# Patient Record
Sex: Female | Born: 1995 | Race: White | Hispanic: No | Marital: Single | Smoking: Never smoker
Health system: Southern US, Community
[De-identification: ages and names within clinical notes are randomized; demographics above are authoritative.]

---

## 2014-04-17 ENCOUNTER — Ambulatory Visit: Payer: Self-pay | Admitting: Medical

## 2015-10-12 ENCOUNTER — Other Ambulatory Visit: Payer: Self-pay | Admitting: Obstetrics and Gynecology

## 2015-10-12 DIAGNOSIS — N6320 Unspecified lump in the left breast, unspecified quadrant: Principal | ICD-10-CM

## 2015-10-12 DIAGNOSIS — N6325 Unspecified lump in the left breast, overlapping quadrants: Secondary | ICD-10-CM

## 2015-10-29 ENCOUNTER — Ambulatory Visit: Payer: Self-pay | Attending: Obstetrics and Gynecology

## 2016-08-07 ENCOUNTER — Emergency Department: Payer: Self-pay

## 2016-08-07 ENCOUNTER — Encounter: Payer: Self-pay | Admitting: Emergency Medicine

## 2016-08-07 DIAGNOSIS — W2209XA Striking against other stationary object, initial encounter: Secondary | ICD-10-CM | POA: Insufficient documentation

## 2016-08-07 DIAGNOSIS — Y999 Unspecified external cause status: Secondary | ICD-10-CM | POA: Insufficient documentation

## 2016-08-07 DIAGNOSIS — Y939 Activity, unspecified: Secondary | ICD-10-CM | POA: Insufficient documentation

## 2016-08-07 DIAGNOSIS — S62326A Displaced fracture of shaft of fifth metacarpal bone, right hand, initial encounter for closed fracture: Secondary | ICD-10-CM | POA: Insufficient documentation

## 2016-08-07 DIAGNOSIS — Y929 Unspecified place or not applicable: Secondary | ICD-10-CM | POA: Insufficient documentation

## 2016-08-07 NOTE — ED Triage Notes (Signed)
Pt ambulatory to triage in NAD, report punched door with right hand, now pain and swelling.  Pt states initially some deformity but someone squeezed hand and popped back into place.  Swelling noted.

## 2016-08-08 ENCOUNTER — Emergency Department
Admission: EM | Admit: 2016-08-08 | Discharge: 2016-08-08 | Disposition: A | Discharge status: Home / Self Care | Payer: Self-pay | Attending: Emergency Medicine | Admitting: Emergency Medicine

## 2016-08-08 DIAGNOSIS — S62326A Displaced fracture of shaft of fifth metacarpal bone, right hand, initial encounter for closed fracture: Secondary | ICD-10-CM

## 2016-08-08 DIAGNOSIS — S62339A Displaced fracture of neck of unspecified metacarpal bone, initial encounter for closed fracture: Secondary | ICD-10-CM

## 2016-08-08 MED ORDER — OXYCODONE-ACETAMINOPHEN 5-325 MG PO TABS
1.0000 | ORAL_TABLET | Freq: Once | ORAL | Status: AC
  Administered 2016-08-08: 1 via ORAL
  Filled 2016-08-08: qty 1

## 2016-08-08 MED ORDER — OXYCODONE-ACETAMINOPHEN 5-325 MG PO TABS: 1.0000 | ORAL_TABLET | Freq: Four times a day (QID) | ORAL | 0 refills | Status: AC | PRN

## 2016-08-08 NOTE — ED Provider Notes (Signed)
[facility] Emergency Department Provider Note   ____________________________________________   First MD Initiated Contact with Patient 08/08/16 0302     (approximate)  I have reviewed the triage vital signs and the nursing notes.   HISTORY  Chief Complaint Hand Injury    HPI Lauren Shields is a 21 y.o. female who comes into the hospital today with thinking she might a broken her hand. The patient reports that she punched a door. She reports that she iced it in the waiting room. She didn't take any medicine but did have some pain after she punched the door. The patient rates her pain a 7-8 out of 10 in intensity and reports this worse when she moves it but not that bad when she is holding it still. The patient is able to move her fingers and denies any numbness or tingling. Her sensation is intact. She reports that she has broken her wrist before but never her hand. The patient denies any nausea or vomiting. She is here today for evaluation.   History reviewed. No pertinent past medical history.  There are no active problems to display for this patient.   History reviewed. No pertinent surgical history.  Prior to Admission medications   Medication Sig Start Date End Date Taking? Authorizing Provider  oxyCODONE-acetaminophen (ROXICET) 5-325 MG tablet Take 1 tablet by mouth every 6 (six) hours as needed. 08/08/16   Rebecka Apley, MD    Allergies Gardasil Gershon Crane vaccine recombinant (yeast derived)]  History reviewed. No pertinent family history.  Social History Social History  Substance Use Topics  . Smoking status: Never Smoker  . Smokeless tobacco: Never Used  . Alcohol use Yes    Review of Systems  Constitutional: No fever/chills Eyes: No visual changes. ENT: No sore throat. Cardiovascular: Denies chest pain. Respiratory: Denies shortness of breath. Gastrointestinal: No abdominal pain.  No nausea, no vomiting.  No diarrhea.  No  constipation. Genitourinary: Negative for dysuria. Musculoskeletal: Right hand pain Skin: Negative for rash. Neurological: Negative for headaches, focal weakness or numbness.   ____________________________________________   PHYSICAL EXAM:  VITAL SIGNS: ED Triage Vitals  Enc Vitals Group     BP 08/07/16 2315 111/81     Pulse Rate 08/07/16 2315 79     Resp 08/07/16 2315 18     Temp 08/07/16 2315 98.9 F (37.2 C)     Temp Source 08/07/16 2315 Oral     SpO2 08/07/16 2315 98 %     Weight 08/07/16 2316 120 lb (54.4 kg)     Height 08/07/16 2316 5\' 4"  (1.626 m)     Head Circumference --      Peak Flow --      Pain Score 08/07/16 2315 5     Pain Loc --      Pain Edu? --      Excl. in GC? --     Constitutional: Alert and oriented. Well appearing and in Moderate distress. Eyes: Conjunctivae are normal. PERRL. EOMI. Head: Atraumatic. Nose: No congestion/rhinnorhea. Mouth/Throat: Mucous membranes are moist.  Oropharynx non-erythematous. Cardiovascular: Normal rate, regular rhythm. Grossly normal heart sounds.  Good peripheral circulation. Respiratory: Normal respiratory effort.  No retractions. Lungs CTAB. Gastrointestinal: Soft and nontender. No distention. Positive bowel sounds Musculoskeletal: Soft tissue swelling to the right lateral dorsal hand with some bruising noted in to palpation but sensation color and motion intact distally.  Neurologic:  Normal speech and language.  Skin:  Skin is warm, dry and intact.  Psychiatric: Mood and affect are normal.   ____________________________________________   LABS (all labs ordered are listed, but only abnormal results are displayed)  Labs Reviewed - No data to display ____________________________________________  EKG  none ____________________________________________  RADIOLOGY  DG right hand ____________________________________________   PROCEDURES  Procedure(s) performed: please, see procedure note(s).  .Splint  Application Date/Time: 08/08/2016 3:45 AM Performed by: Rebecka ApleyWEBSTER, Daymond Cordts P Authorized by: Rebecka ApleyWEBSTER, Garv Kuechle P   Consent:    Consent obtained:  Verbal   Consent given by:  Patient Pre-procedure details:    Sensation:  Normal Procedure details:    Laterality:  Right   Location:  Hand   Cast type:  Short arm   Splint type:  Ulnar gutter   Supplies:  Ortho-Glass Post-procedure details:    Pain:  Unchanged   Sensation:  Normal   Patient tolerance of procedure:  Tolerated well, no immediate complications    Critical Care performed: No  ____________________________________________   INITIAL IMPRESSION / ASSESSMENT AND PLAN / ED COURSE  Pertinent labs & imaging results that were available during my care of the patient were reviewed by me and considered in my medical decision making (see chart for details).  This is a 21 year old female who comes into the hospital today with right hand pain. The patient developed this pain and swelling after she punched a door. The patient received an x-ray and it appears that she has a boxer's fracture or a fifth metacarpal fracture. I did give the patient a Percocet and I placed an ulnar gutter splint. She will be discharged to follow-up with orthopedic surgery. The patient has no complaints at this time.  Clinical Course as of Aug 09 403  Fri Aug 08, 2016  0250 Angulated and displaced fracture involving the mid to distal shaft of the fifth metacarpal    DG Hand Complete Right [AW]    Clinical Course User Index [AW] Rebecka ApleyWebster, Kalia Vahey P, MD     ____________________________________________   FINAL CLINICAL IMPRESSION(S) / ED DIAGNOSES  Final diagnoses:  Closed boxer's fracture, initial encounter  Closed displaced fracture of shaft of fifth metacarpal bone of right hand, initial encounter      NEW MEDICATIONS STARTED DURING THIS VISIT:  New Prescriptions   OXYCODONE-ACETAMINOPHEN (ROXICET) 5-325 MG TABLET    Take 1 tablet by mouth  every 6 (six) hours as needed.     Note:  This document was prepared using Dragon voice recognition software and may include unintentional dictation errors.    Rebecka ApleyWebster, Tallin Hart P, MD 08/08/16 [phone]

## 2016-08-08 NOTE — Discharge Instructions (Signed)
Please follow up with orthopedic surgery, please ice and elevate your hand and return with worse pain.

## 2017-12-31 IMAGING — CR DG HAND COMPLETE 3+V*R*
4 series · 4 of 4 positions shown · non-contrast
Comparison: None.

CLINICAL DATA: Punched a door, pain and swelling

EXAM:
RIGHT HAND - COMPLETE 3+ VIEW

[hand ap]
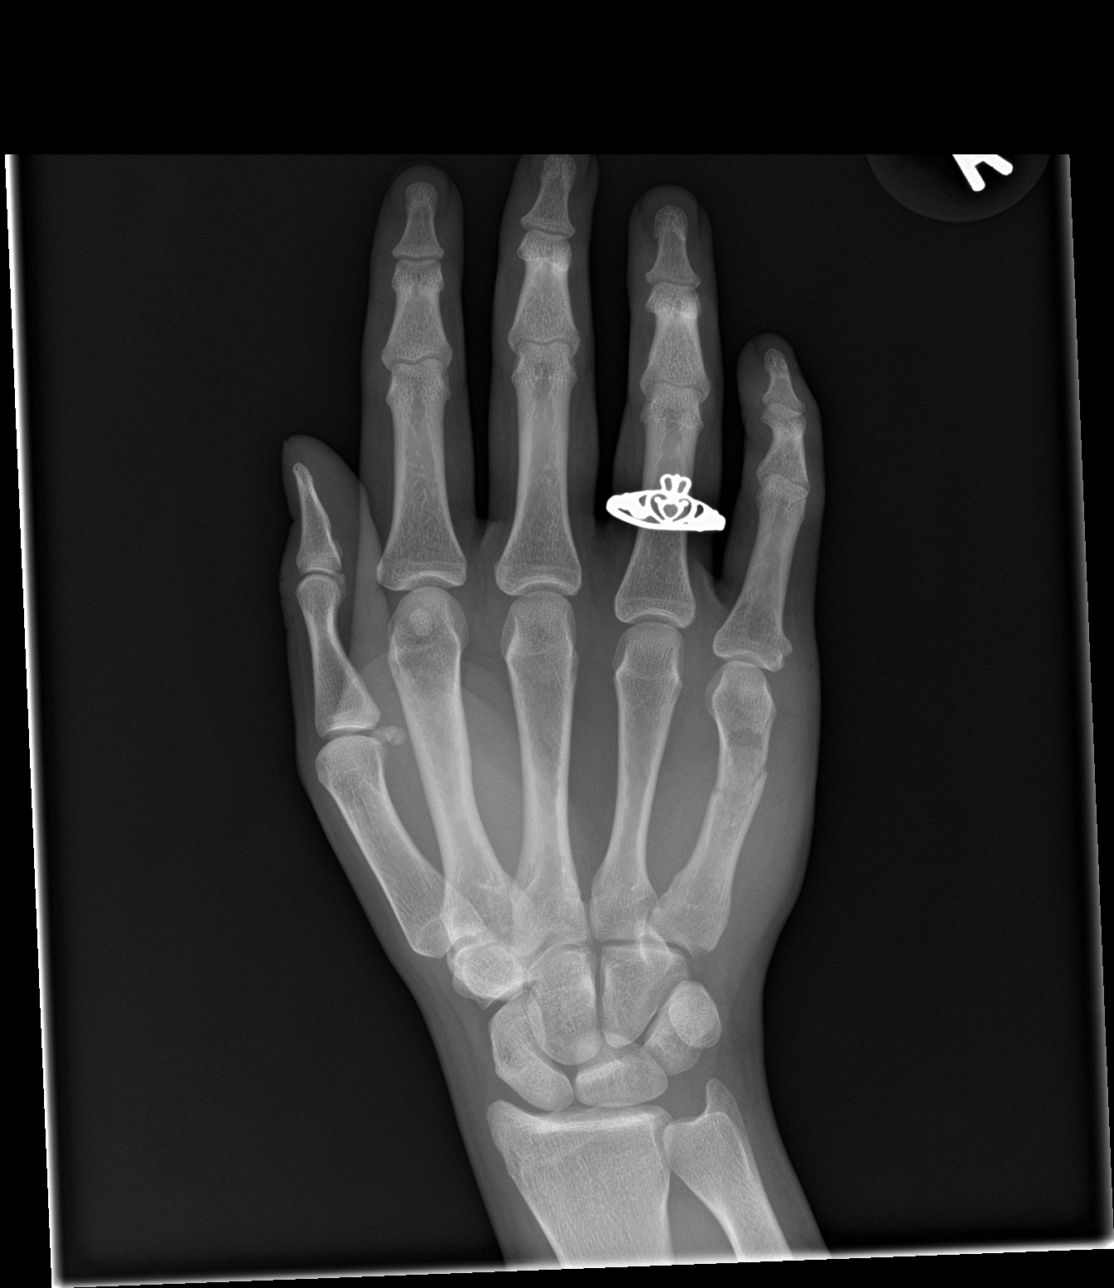

[hand obl]
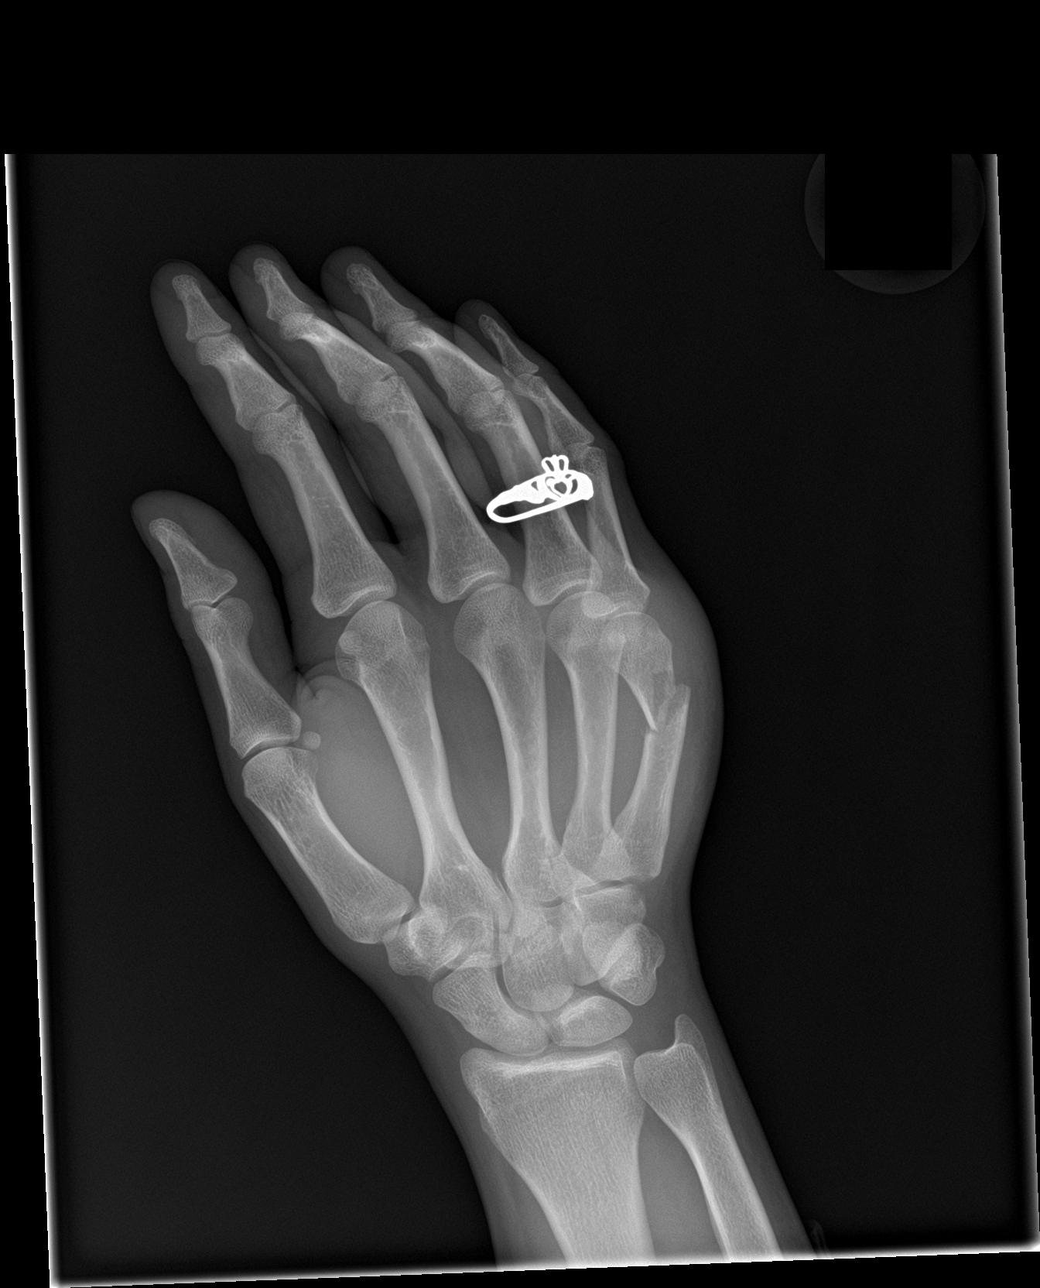

[hand lat (1 of 2)]
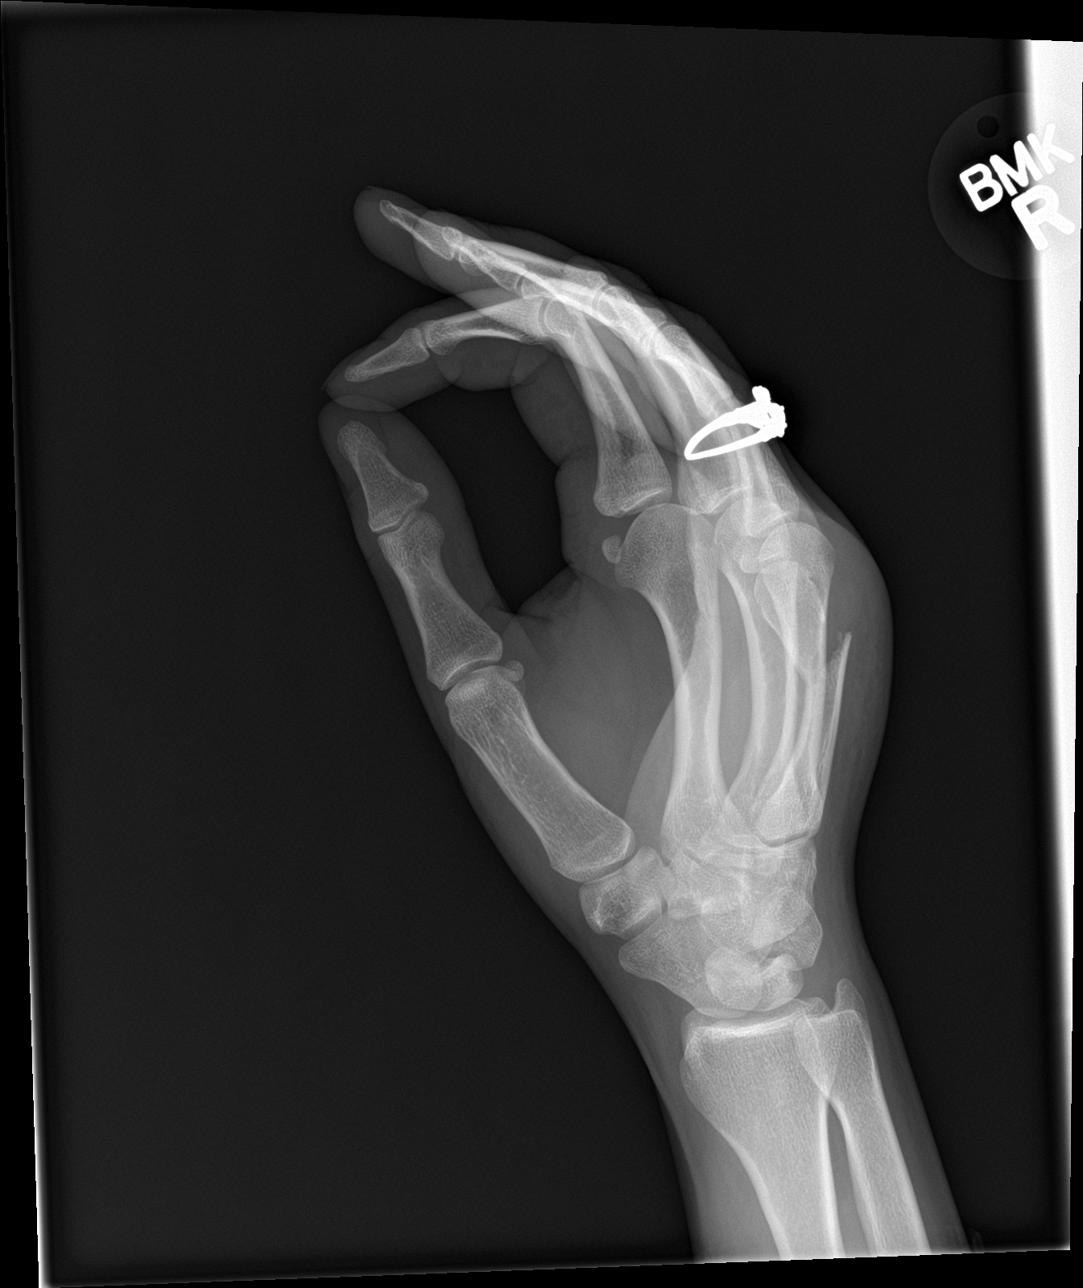

[hand lat (2 of 2)]
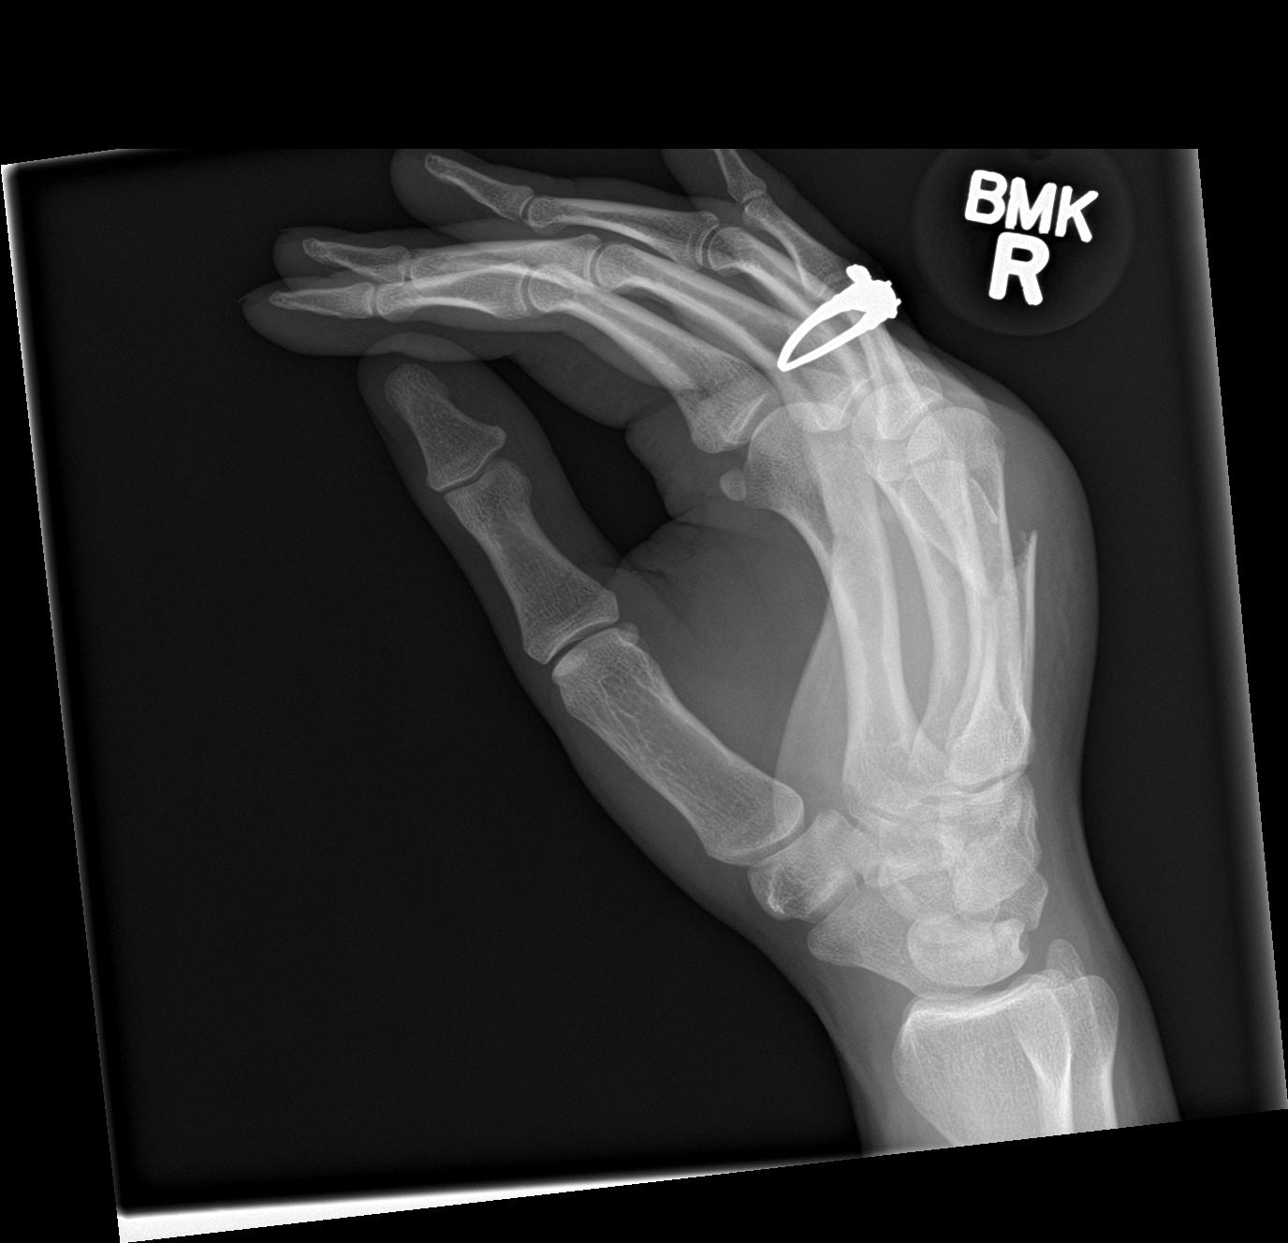

[4 of 4 positions shown; findings below may reference images not displayed]

FINDINGS: Acute fracture through the mid to distal shaft of the fifth
metacarpal. Moderate palmar angulation of distal fracture fragment
and approximately [DATE] shaft diameter of volar displacement of distal
fracture fragment. There is overlying soft tissue swelling.
IMPRESSION: Angulated and displaced fracture involving the mid to distal shaft
of the fifth metacarpal
